# Patient Record
Sex: Male | Born: 1980
Health system: Southern US, Community
[De-identification: ages and names within clinical notes are randomized; demographics above are authoritative.]

## PROBLEM LIST (undated history)

## (undated) DIAGNOSIS — I10 Essential (primary) hypertension: Secondary | ICD-10-CM

## (undated) HISTORY — PX: SHOULDER SURGERY: SHX246

## (undated) HISTORY — PX: OTHER SURGICAL HISTORY: SHX169

## (undated) HISTORY — PX: KNEE SURGERY: SHX244

---
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
## 2020-05-26 ENCOUNTER — Emergency Department (HOSPITAL_BASED_OUTPATIENT_CLINIC_OR_DEPARTMENT_OTHER): Payer: BC Managed Care – PPO

## 2020-05-26 ENCOUNTER — Other Ambulatory Visit: Payer: Self-pay

## 2020-05-26 ENCOUNTER — Encounter (HOSPITAL_BASED_OUTPATIENT_CLINIC_OR_DEPARTMENT_OTHER): Payer: Self-pay

## 2020-05-26 ENCOUNTER — Emergency Department (HOSPITAL_BASED_OUTPATIENT_CLINIC_OR_DEPARTMENT_OTHER)
Admission: EM | Admit: 2020-05-26 | Discharge: 2020-05-26 | Disposition: A | Discharge status: Home / Self Care | Payer: BC Managed Care – PPO | Attending: Emergency Medicine | Admitting: Emergency Medicine

## 2020-05-26 DIAGNOSIS — I10 Essential (primary) hypertension: Secondary | ICD-10-CM | POA: Insufficient documentation

## 2020-05-26 DIAGNOSIS — Z20822 Contact with and (suspected) exposure to covid-19: Secondary | ICD-10-CM | POA: Diagnosis not present

## 2020-05-26 DIAGNOSIS — R0981 Nasal congestion: Secondary | ICD-10-CM

## 2020-05-26 DIAGNOSIS — F17290 Nicotine dependence, other tobacco product, uncomplicated: Secondary | ICD-10-CM | POA: Insufficient documentation

## 2020-05-26 LAB — CBC WITH DIFFERENTIAL/PLATELET
Abs Immature Granulocytes: 0.01 10*3/uL (ref 0.00–0.07)
Basophils Absolute: 0 10*3/uL (ref 0.0–0.1)
Basophils Relative: 1 %
Eosinophils Absolute: 0.1 10*3/uL (ref 0.0–0.5)
Eosinophils Relative: 2 %
HCT: 48.6 % (ref 39.0–52.0)
Hemoglobin: 16.4 g/dL (ref 13.0–17.0)
Immature Granulocytes: 0 %
Lymphocytes Relative: 42 %
Lymphs Abs: 2.4 10*3/uL (ref 0.7–4.0)
MCH: 29.8 pg (ref 26.0–34.0)
MCHC: 33.7 g/dL (ref 30.0–36.0)
MCV: 88.2 fL (ref 80.0–100.0)
Monocytes Absolute: 0.3 10*3/uL (ref 0.1–1.0)
Monocytes Relative: 6 %
Neutro Abs: 2.8 10*3/uL (ref 1.7–7.7)
Neutrophils Relative %: 49 %
Platelets: 233 10*3/uL (ref 150–400)
RBC: 5.51 MIL/uL (ref 4.22–5.81)
RDW: 14.6 % (ref 11.5–15.5)
WBC: 5.7 10*3/uL (ref 4.0–10.5)
nRBC: 0 % (ref 0.0–0.2)

## 2020-05-26 LAB — COMPREHENSIVE METABOLIC PANEL
ALT: 30 U/L (ref 0–44)
AST: 29 U/L (ref 15–41)
Albumin: 4.4 g/dL (ref 3.5–5.0)
Alkaline Phosphatase: 56 U/L (ref 38–126)
Anion gap: 13 (ref 5–15)
BUN: 16 mg/dL (ref 6–20)
CO2: 27 mmol/L (ref 22–32)
Calcium: 9.2 mg/dL (ref 8.9–10.3)
Chloride: 102 mmol/L (ref 98–111)
Creatinine, Ser: 1.25 mg/dL — ABNORMAL HIGH (ref 0.61–1.24)
GFR, Estimated: >60 mL/min (ref >60)
Glucose, Bld: 130 mg/dL — ABNORMAL HIGH (ref 70–99)
Potassium: 3.9 mmol/L (ref 3.5–5.1)
Sodium: 142 mmol/L (ref 135–145)
Total Bilirubin: 1.6 mg/dL — ABNORMAL HIGH (ref 0.3–1.2)
Total Protein: 8.2 g/dL — ABNORMAL HIGH (ref 6.5–8.1)

## 2020-05-26 LAB — URINALYSIS, ROUTINE W REFLEX MICROSCOPIC
Bilirubin Urine: NEGATIVE
Glucose, UA: NEGATIVE mg/dL
Hgb urine dipstick: NEGATIVE
Ketones, ur: NEGATIVE mg/dL
Leukocytes,Ua: NEGATIVE
Nitrite: NEGATIVE
Protein, ur: NEGATIVE mg/dL
Specific Gravity, Urine: 1.02 (ref 1.005–1.030)
pH: 6.5 (ref 5.0–8.0)

## 2020-05-26 LAB — RESP PANEL BY RT-PCR (FLU A&B, COVID) ARPGX2
Influenza A by PCR: NEGATIVE
Influenza B by PCR: NEGATIVE
SARS Coronavirus 2 by RT PCR: NEGATIVE

## 2020-05-26 LAB — SEDIMENTATION RATE: Sed Rate: 5 mm/hr (ref 0–16)

## 2020-05-26 MED ORDER — LOSARTAN POTASSIUM 25 MG PO TABS: 25.0000 mg | ORAL_TABLET | Freq: Every day | ORAL | 0 refills | Status: AC

## 2020-05-26 MED ORDER — FLUTICASONE PROPIONATE 50 MCG/ACT NA SUSP: 2.0000 | Freq: Every day | NASAL | 2 refills | Status: AC

## 2020-05-26 MED ORDER — IOHEXOL 350 MG/ML SOLN
100.0000 mL | Freq: Once | INTRAVENOUS | Status: AC | PRN
  Administered 2020-05-26: 17:00:00 100 mL via INTRAVENOUS

## 2020-05-26 MED ORDER — LOSARTAN POTASSIUM 25 MG PO TABS
25.0000 mg | ORAL_TABLET | Freq: Once | ORAL | Status: AC
  Administered 2020-05-26: 16:00:00 25 mg via ORAL
  Filled 2020-05-26: qty 1

## 2020-05-26 NOTE — Discharge Instructions (Addendum)
Follow up with your primary care provider Monday, December 20 at 9:45AM.   General Viral Syndrome Care Instructions:  Your nasal and sinus symptoms may be caused by a viral illness. Viruses do not require or respond to antibiotics. Treatment is symptomatic care and it is important to note that these symptoms may last for 7-14 days.   Hand washing: Wash your hands throughout the day, but especially before and after touching the face, using the restroom, sneezing, coughing, or touching surfaces that have been coughed or sneezed upon. Hydration: Symptoms of most illnesses will be intensified and complicated by dehydration. Dehydration can also extend the duration of symptoms. Drink plenty of fluids and get plenty of rest. You should be drinking at least half a liter of water an hour to stay hydrated. Electrolyte drinks (ex. Gatorade, Powerade, Pedialyte) are also encouraged. You should be drinking enough fluids to make your urine light yellow, almost clear. If this is not the case, you are not drinking enough water. Please note that some of the treatments indicated below will not be effective if you are not adequately hydrated. Diet: Please concentrate on hydration, however, you may introduce food slowly.  Start with a clear liquid diet, progressed to a full liquid diet, and then bland solids as you are able. Pain or fever: Ibuprofen, Naproxen, or acetaminophen (generic for Tylenol) for pain or fever.  Antiinflammatory medications: Take 600 mg of ibuprofen every 6 hours or 440 mg (over the counter dose) to 500 mg (prescription dose) of naproxen every 12 hours for the next 3 days. After this time, these medications may be used as needed for pain. Take these medications with food to avoid upset stomach. Choose only one of these medications, do not take them together. Acetaminophen (generic for Tylenol): Should you continue to have additional pain while taking the ibuprofen or naproxen, you may add in  acetaminophen as needed. Your daily total maximum amount of acetaminophen from all sources should be limited to 4000mg /day for persons without liver problems, or 2000mg /day for those with liver problems. Zyrtec or Claritin: May add these medication daily to control underlying symptoms of congestion, sneezing, and other signs of allergies.  These medications are available over-the-counter. Generics: Cetirizine (generic for Zyrtec) and loratadine (generic for Claritin). Fluticasone: Use fluticasone (generic for Flonase), as directed, for nasal and sinus congestion.  This medication is available over-the-counter. Congestion: Plain guaifenesin (generic for plain Mucinex) may help relieve congestion. Saline sinus rinses and saline nasal sprays may also help relieve congestion.  Sore throat: Warm liquids or Chloraseptic spray may help soothe a sore throat. Gargle twice a day with a salt water solution made from a half teaspoon of salt in a cup of warm water.  Return: Return to the ED for significantly worsening symptoms, shortness of breath, persistent vomiting, large amounts of blood in stool, or any other major concerns.  For prescription assistance, may try using prescription discount sites or apps, such as goodrx.com

## 2020-05-26 NOTE — ED Provider Notes (Signed)
[facility] Provider Note   CSN: [id number] Arrival date & time: 05/26/20  1351     History Chief Complaint  Patient presents with  . URI  . Hypertension    Douglas Barnett is a 39 y.o. male.  HPI      Douglas Barnett is a 39 y.o. 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 He went to urgent care because he experienced an incident yesterday where he was sitting in his car and noticed swelling to the right temple. This lasted for about 5 min, but then resolved and has not recurred to the same extent, though he does say he thinks his right temple currently seems more swollen than the left. When asked about pain, he denies any pain in this area, however, when speaking to him about his sinus complaints below, he mentions pain that extend from the forehead toward the right temple.  When the urgent care noted his blood pressure, they sent him to the emergency department.  Patient does not have a history of hypertension as far as he knows.  He does state he has had increased stress from a new job. He has not been able to work out as much as he used to and has not been eating as nutritiously due to increased travel.  He notes he has had increased instances of becoming winded after a long flight of stairs, especially over the last 2 weeks.  When asked about vision changes, he indicates has had what he describes as blurry vision, worse on the right eye, but this has been present for about the last 2 years.  It is constant, does not seem to be worsening, and has only been noted with distance vision.  He does not wear glasses or use contact lenses.  He has been experiencing nasal congestion, rhinorrhea, and sinus pressure for about the last week.  He was initially experiencing ear pressure, especially on the left, following a recent airplane flight, but this has resolved.  He also notes after rinsing  with a Nettie pot last night his sinus symptoms improved.    Denies fever/chills, dizziness, syncope, N/V/D, vision loss, chest pain, current shortness of breath, cough, abdominal pain, new back pain, facial droop, cognitive deficits, weakness, numbness, neck pain, or any other complaints.   Past Medical History:  Diagnosis Date  . Optic nerve tumor, right 1982    There are no problems to display for this patient.   Past Surgical History:  Procedure Laterality Date  . KNEE SURGERY    . optic nerve tumor surgery    . SHOULDER SURGERY         History reviewed. No pertinent family history.  Social History   Tobacco Use  . Smoking status: Current Some Day Smoker    Types: Cigars  . Smokeless tobacco: Never Used  Vaping Use  . Vaping Use: Never used  Substance Use Topics  . Alcohol use: Yes    Comment: occ  . Drug use: Never    Home Medications Prior to Admission medications   Medication Sig Start Date End Date Taking? Authorizing Provider  fluticasone (FLONASE) 50 MCG/ACT nasal spray Place 2 sprays into both nostrils daily. 05/26/20   Thelmer Legler C, PA-C  losartan (COZAAR) 25 MG tablet Take 1 tablet (25 mg total) by mouth daily. 05/26/20 06/25/20  Veera Stapleton, Helane Gunther, PA-C    Allergies    Penicillins  Review  of Systems   Review of Systems  Constitutional: Negative for chills, diaphoresis and fever.  HENT: Positive for congestion, facial swelling, rhinorrhea and sinus pressure. Negative for ear discharge, hearing loss, sore throat, trouble swallowing and voice change.   Eyes: Positive for visual disturbance. Negative for photophobia and pain.  Respiratory: Negative for cough and shortness of breath.   Cardiovascular: Negative for chest pain and leg swelling.  Gastrointestinal: Negative for abdominal pain, diarrhea, nausea and vomiting.  Genitourinary: Negative for difficulty urinating.  Musculoskeletal: Negative for back pain and neck pain.  Neurological: Negative for  dizziness, seizures, syncope, facial asymmetry, speech difficulty, weakness, light-headedness and headaches.  All other systems reviewed and are negative.   Physical Exam Updated Vital Signs BP (!) 177/135 (BP Location: Left Arm)   Pulse 82   Temp 98.4 F (36.9 C) (Oral)   Resp 20   Ht 6' (1.829 m)   Wt 97.5 kg   SpO2 100%   BMI 29.16 kg/m   Physical Exam Vitals and nursing note reviewed.  Constitutional:      General: He is not in acute distress.    Appearance: He is well-developed. He is not diaphoretic.  HENT:     Head: Normocephalic and atraumatic. No right periorbital erythema or left periorbital erythema.     Jaw: No trismus, tenderness, swelling or pain on movement.     Comments: No obvious swelling to the right temple or other areas of the scalp when compared to left. No tenderness, color abnormality. No obvious increased pulsitility.     Right Ear: Tympanic membrane, ear canal and external ear normal.     Left Ear: Tympanic membrane, ear canal and external ear normal.     Nose: Mucosal edema and congestion present.     Right Sinus: No maxillary sinus tenderness or frontal sinus tenderness.     Left Sinus: No maxillary sinus tenderness or frontal sinus tenderness.     Mouth/Throat:     Mouth: Mucous membranes are moist.     Pharynx: Oropharynx is clear.  Eyes:     Extraocular Movements: Extraocular movements intact.     Conjunctiva/sclera: Conjunctivae normal.     Pupils: Pupils are equal, round, and reactive to light.     Comments:   Visual Acuity  Right Eye Distance: 20/30 Left Eye Distance: 20/20 Bilateral Distance: 20/20 (Pt did not use corrective lenses for thest exams)  Right Eye Near:   Left Eye Near:    Bilateral Near:     Cardiovascular:     Rate and Rhythm: Normal rate and regular rhythm.     Pulses: Normal pulses.          Radial pulses are 2+ on the right side and 2+ on the left side.       Posterior tibial pulses are 2+ on the right side and 2+  on the left side.     Heart sounds: Normal heart sounds.     Comments: Tactile temperature in the extremities appropriate and equal bilaterally. Pulmonary:     Effort: Pulmonary effort is normal. No respiratory distress.     Breath sounds: Normal breath sounds.  Abdominal:     Palpations: Abdomen is soft.     Tenderness: There is no abdominal tenderness. There is no guarding.  Musculoskeletal:     Cervical back: Normal range of motion and neck supple. No tenderness. No pain with movement.     Right lower leg: No edema.     Left  lower leg: No edema.  Lymphadenopathy:     Cervical: No cervical adenopathy.  Skin:    General: Skin is warm and dry.  Neurological:     Mental Status: He is alert and oriented to person, place, and time.     Comments: No noted acute cognitive deficit. Sensation grossly intact to light touch in the extremities.   Grip strengths equal bilaterally.   Strength 5/5 in all extremities.  No gait disturbance.  Coordination intact.  Cranial nerves III-XII grossly intact.  Handles oral secretions without noted difficulty.  No noted phonation or speech deficit. No facial droop.   Psychiatric:        Mood and Affect: Mood and affect normal.        Speech: Speech normal.        Behavior: Behavior normal.     ED Results / Procedures / Treatments   Labs (all labs ordered are listed, but only abnormal results are displayed) Labs Reviewed  COMPREHENSIVE METABOLIC PANEL - Abnormal; Notable for the following components:      Result Value   Glucose, Bld 130 (*)    Creatinine, Ser 1.25 (*)    Total Protein 8.2 (*)    Total Bilirubin 1.6 (*)    All other components within normal limits  RESP PANEL BY RT-PCR (FLU A&B, COVID) [id number]  URINALYSIS, ROUTINE W REFLEX MICROSCOPIC  SEDIMENTATION RATE  CBC WITH DIFFERENTIAL/PLATELET    EKG EKG Interpretation  Date/Time:  Thursday May 26 2020 15:36:24 EST Ventricular Rate:  67 PR Interval:    QRS  Duration: 83 QT Interval:  383 QTC Calculation: 405 R Axis:   70 Text Interpretation: Sinus rhythm no prior for comparison Confirmed by Madalyn Rob [phone]) on 05/26/2020 7:27:02 PM   Radiology CT Angio Head W or Wo Contrast  Result Date: 05/26/2020 CLINICAL DATA:  Vision loss, monocular. Additional history provided: Vision loss, flying recently, felt "pop" in right ear, sinusitis symptoms today, elevated blood pressure, history of right optic nerve tumor 39 years ago. EXAM: CT ANGIOGRAPHY HEAD TECHNIQUE: Multidetector CT imaging of the head was performed using the standard protocol during bolus administration of intravenous contrast. Multiplanar CT image reconstructions and MIPs were obtained to evaluate the vascular anatomy. CONTRAST:  157mL OMNIPAQUE IOHEXOL 350 MG/ML SOLN COMPARISON:  No pertinent prior exams available for comparison. FINDINGS: CT HEAD Brain: Cerebral volume is normal for age. There is no acute intracranial hemorrhage. No demarcated cortical infarct. No extra-axial fluid collection. No evidence of intracranial mass. No midline shift. Vascular: Reported below. Skull: Right frontal cranioplasty. No calvarial fracture or focal suspicious osseous lesion. Sinuses: Bilateral sphenoid sinus mucous retention cysts Orbits: Small linear hyperdensity along the right optic nerve sheath likely reflecting a metallic clip. CTA HEAD Anterior circulation: The intracranial internal carotid arteries are patent. The M1 middle cerebral arteries are patent. No M2 proximal branch occlusion or high-grade proximal stenosis is identified. Mild atherosclerotic irregularity of the M2 and more distal MCA branch vessels bilaterally. No intracranial aneurysm is identified. Posterior circulation: The visualized intracranial vertebral arteries are patent. The basilar artery is patent. The posterior cerebral arteries are patent. Posterior communicating arteries are present bilaterally. Venous sinuses: Within the  limitations of contrast timing, no convincing thrombus. Anatomic variants: None significant IMPRESSION: CT head: 1. No evidence of acute intracranial abnormality. 2. Right frontal cranioplasty. A small linear hyperdensity along the right optic nerve sheath likely reflects a metallic clip. Correlate with procedural history. 3. Bilateral sphenoid sinus mucous retention cyst. CTA  head: 1. No intracranial large vessel occlusion or proximal high-grade arterial stenosis. Mild atherosclerotic irregularity of the M2 and more distal MCA branch vessels bilaterally. 2. No intracranial aneurysm is identified. Electronically Signed   By: Kellie Simmering DO   On: 05/26/2020 17:25   DG Chest 2 View  Result Date: 05/26/2020 CLINICAL DATA:  Shortness of breath for several weeks EXAM: CHEST - 2 VIEW COMPARISON:  None. FINDINGS: Cardiac shadow is within normal limits. The lungs are clear bilaterally. No bony abnormality is noted. IMPRESSION: No active cardiopulmonary disease. Electronically Signed   By: Inez Catalina M.D.   On: 05/26/2020 17:23    Procedures Procedures (including critical care time)  Medications Ordered in ED Medications  losartan (COZAAR) tablet 25 mg (25 mg Oral Given 05/26/20 1557)  iohexol (OMNIPAQUE) 350 MG/ML injection 100 mL (100 mLs Intravenous Contrast Given 05/26/20 1653)    ED Course  I have reviewed the triage vital signs and the nursing notes.  Pertinent labs & imaging results that were available during my care of the patient were reviewed by me and considered in my medical decision making (see chart for details).  Clinical Course as of 05/27/20 0008  Thu May 26, 2020  1525 BP(!): 154/115 Last noted BP in the system was 126/78 at an annual physical with his PCP June 2021. I adjusted the patient's blood pressure cuff and increase the size of the cuff to the circumference of the patient's arms and was able to obtain this somewhat improved measurement compared to previous values  obtained in his visit. [SJ]  [place] with Nena Polio, NP, patient's PCP. We discussed the patient's symptoms, blood pressure, and proposed work-up here in the ED. She requests we start the patient on losartan 25 mg daily.  She will continue to manage and work-up the patient's hypertension. She has had a cancellation in her schedule and so she will see the patient on December 20 at 9:45 AM. We discussed that we would obtain CTA of the head here in the ED.  She will coordinate obtaining MRI of the brain with and without contrast. [SJ]  1805 Creatinine(!): 1.25 Noted to be 1.43 in June 2021. [SJ]  1810 Total Bilirubin(!): 1.6 Noted to be 1.8 in June 2021. [SJ]    Clinical Course User Index [SJ] Alegandro Macnaughton, Helane Gunther, PA-C   MDM Rules/Calculators/A&P                          Patient presents with a few complaints. Patient is nontoxic appearing, afebrile, not tachycardic, not tachypneic, not hypotensive, maintains excellent SPO2 on room air, and is in no apparent distress.   I have reviewed the patient's chart to obtain more information.   I reviewed and interpreted the patient's labs and radiological studies. No focal neurologic deficits.  No acute, emergent abnormalities on CTA. ESR negative.  CRP would have been a send out test and so therefore was not performed at this site. Recommended and referred for ophthalmology follow-up. He has close follow-up scheduled with his PCP.  The patient was given instructions for home care as well as return precautions. Patient voices understanding of these instructions, accepts the plan, and is comfortable with discharge.  Findings and plan of care discussed with attending physician, Madalyn Rob, MD. Dr. Roslynn Amble personally evaluated and examined this patient.    Final Clinical Impression(s) / ED Diagnoses Final diagnoses:  Primary hypertension  Sinus congestion    Rx / DC  Orders ED Discharge Orders         Ordered    losartan (COZAAR) 25  MG tablet  Daily        05/26/20 1850    fluticasone (FLONASE) 50 MCG/ACT nasal spray  Daily        05/26/20 1850           Layla Maw 05/27/20 0008    Lucrezia Starch, MD 05/28/20 1216

## 2020-05-26 NOTE — ED Notes (Signed)
Pt at CT will get vitals when he returns

## 2020-05-26 NOTE — ED Triage Notes (Signed)
Pt c/o sinus issues and ears popping x 1 week-states he does fly 3-4x/week-concerned he had "swelling to right temple yesterday"/vision changes to right eye-states he was seen at [facility] and sent to ED for eval and advised his BP was high-NAD-steady gait

## 2020-05-26 NOTE — ED Notes (Signed)
Pt is on the monitor and vitals cycling

## 2020-07-28 ENCOUNTER — Other Ambulatory Visit (HOSPITAL_COMMUNITY): Payer: Self-pay | Admitting: Student

## 2020-07-28 ENCOUNTER — Emergency Department (HOSPITAL_BASED_OUTPATIENT_CLINIC_OR_DEPARTMENT_OTHER)
Admission: EM | Admit: 2020-07-28 | Discharge: 2020-07-28 | Disposition: A | Discharge status: Home / Self Care | Payer: BC Managed Care – PPO | Attending: Emergency Medicine | Admitting: Emergency Medicine

## 2020-07-28 ENCOUNTER — Other Ambulatory Visit: Payer: Self-pay

## 2020-07-28 ENCOUNTER — Encounter (HOSPITAL_BASED_OUTPATIENT_CLINIC_OR_DEPARTMENT_OTHER): Payer: Self-pay | Admitting: *Deleted

## 2020-07-28 DIAGNOSIS — S4992XA Unspecified injury of left shoulder and upper arm, initial encounter: Secondary | ICD-10-CM | POA: Diagnosis present

## 2020-07-28 DIAGNOSIS — I10 Essential (primary) hypertension: Secondary | ICD-10-CM | POA: Insufficient documentation

## 2020-07-28 DIAGNOSIS — X58XXXA Exposure to other specified factors, initial encounter: Secondary | ICD-10-CM | POA: Insufficient documentation

## 2020-07-28 DIAGNOSIS — F17290 Nicotine dependence, other tobacco product, uncomplicated: Secondary | ICD-10-CM | POA: Diagnosis not present

## 2020-07-28 DIAGNOSIS — Z79899 Other long term (current) drug therapy: Secondary | ICD-10-CM | POA: Diagnosis not present

## 2020-07-28 DIAGNOSIS — S46212A Strain of muscle, fascia and tendon of other parts of biceps, left arm, initial encounter: Secondary | ICD-10-CM | POA: Diagnosis not present

## 2020-07-28 HISTORY — DX: Essential (primary) hypertension: I10

## 2020-07-28 LAB — CBG MONITORING, ED: Glucose-Capillary: 90 mg/dL (ref 70–99)

## 2020-07-28 MED ORDER — OXYCODONE-ACETAMINOPHEN 5-325 MG PO TABS
1.0000 | ORAL_TABLET | Freq: Once | ORAL | Status: AC
  Administered 2020-07-28: 1 via ORAL

## 2020-07-28 MED ORDER — OXYCODONE-ACETAMINOPHEN 5-325 MG PO TABS: 1.0000 | ORAL_TABLET | Freq: Three times a day (TID) | ORAL | 0 refills | Status: AC | PRN

## 2020-07-28 MED ORDER — HYDRALAZINE HCL 25 MG PO TABS
50.0000 mg | ORAL_TABLET | Freq: Once | ORAL | Status: AC
  Administered 2020-07-28: 50 mg via ORAL
  Filled 2020-07-28: qty 2

## 2020-07-28 MED ORDER — OXYCODONE-ACETAMINOPHEN 5-325 MG PO TABS
ORAL_TABLET | ORAL | Status: AC
  Filled 2020-07-28: qty 1

## 2020-07-28 MED FILL — OXYCODONE-ACETAMINOPHEN 5-3: 5-325 | 1 days supply | Qty: 3 | Fill #0

## 2020-07-28 NOTE — ED Triage Notes (Addendum)
Left shoulder pain after working out a week ago. No relief with hot and cold compresses. After he googled causes of arm pain he thought he should be checked out. He was seen at an UC and given Flexeril. Pain with movement.

## 2020-07-28 NOTE — Discharge Instructions (Addendum)
You are seen today for muscle strain, as we discussed this can take time to heal. Please follow-up with your orthopedic doctor tomorrow, keep this appointment. Use the Flexeril provided to you, I also want you to take ibuprofen or Tylenol as directed on the bottle, please use the attached instructions. If the pain is severe you can take the Percocet prescribed, do not this is a narcotic, do not drive or operate heavy machinery when taking this medication. They speak to pharmacist about all side effects of this medication. Your blood pressure was also severely elevated today, as we discussed is most likely due to pain, however I want you to follow-up with your primary care in the next couple of days to get this evaluated, use a blood pressure sheet to keep an eye on your blood pressure.  I would recommend buying a blood pressure cuff to keep an eye on this.  If you have any symptoms of chest pain or shortness of breath or headache with elevated blood pressure please come back here.  Get help right away if: You have any of these problems in your injured area: You have numbness. You have tingling. You lose a lot of strength.

## 2020-07-28 NOTE — ED Provider Notes (Signed)
[facility] EMERGENCY DEPARTMENT Provider Note   CSN: [id number] Arrival date & time: 07/28/20  1329     History Chief Complaint  Patient presents with  . Shoulder Pain    Douglas Barnett is a 40 y.o. male with past medical history of hypertension presents emergency department today for shoulder injury.  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  States that shoulder pain is now progressing into his left arm.  Has been worsening, is constant, 10 on 10.  Is able to still use that arm, flex and extend and abduct and adduct arm.  Denies any weakness into the arm, denies any numbness or tingling.  States that he went to urgent care and was given Flexeril, states that he has not been helping.  Primarily here because he wants pain relief.  States he has had appointment with his orthopedic doctor tomorrow and an appointment with his primary care this week.  States that he is also here because he noticed that for the past week since his injury his blood pressure has been slightly increased, 158/108 currently, patient states that he does take losartan and metoprolol for this, has been taking this regularly.  Denies any chest pain, shortness of breath, vision changes, headache, paresthesias.  Did not injure himself elsewhere.  No other complaints. HPI     Past Medical History:  Diagnosis Date  . Hypertension   . Optic nerve tumor, right 1982    There are no problems to display for this patient.   Past Surgical History:  Procedure Laterality Date  . KNEE SURGERY    . optic nerve tumor surgery    . SHOULDER SURGERY         No family history on file.  Social History   Tobacco Use  . Smoking status: Current Some Day Smoker    Types: Cigars  . Smokeless tobacco: Never  Used  Vaping Use  . Vaping Use: Never used  Substance Use Topics  . Alcohol use: Yes    Comment: occ  . Drug use: Never    Home Medications Prior to Admission medications   Medication Sig Start Date End Date Taking? Authorizing Provider  cyclobenzaprine (FLEXERIL) 10 MG tablet Take by mouth. 07/26/20 08/06/20 Yes [provider]  losartan (COZAAR) 50 MG tablet Take by mouth. 07/21/20 08/26/20 Yes [provider]  metoprolol succinate (TOPROL-XL) 50 MG 24 hr tablet Take by mouth. 07/28/20  Yes [provider]  oxyCODONE-acetaminophen (PERCOCET/ROXICET) 5-325 MG tablet Take 1 tablet by mouth every 8 (eight) hours as needed for severe pain. 07/28/20  Yes Reneka Nebergall, PA-C  fluticasone (FLONASE) 50 MCG/ACT nasal spray Place 2 sprays into both nostrils daily. 05/26/20   Joy, Shawn C, PA-C  losartan (COZAAR) 25 MG tablet Take 1 tablet (25 mg total) by mouth daily. 05/26/20 06/25/20  Joy, Helane Gunther, PA-C    Allergies    Penicillins  Review of Systems   Review of Systems  Constitutional: Negative for diaphoresis, fatigue and fever.  Eyes: Negative for visual disturbance.  Respiratory: Negative for shortness of breath.   Cardiovascular: Negative for chest pain.  Gastrointestinal: Negative for nausea and vomiting.  Musculoskeletal: Positive for arthralgias. Negative for back pain and myalgias.  Skin: Negative for color change,  pallor, rash and wound.  Neurological: Negative for syncope, weakness, light-headedness, numbness and headaches.  Psychiatric/Behavioral: Negative for behavioral problems and confusion.    Physical Exam Updated Vital Signs BP (!) 157/108   Pulse 66   Temp 97.8 F (36.6 C) (Oral)   Resp 12   Ht 6\' 1"  (1.854 m)   Wt 101.5 kg   SpO2 97%   BMI 29.51 kg/m   Physical Exam Constitutional:      General: He is not in acute distress.    Appearance: Normal appearance. He is not ill-appearing, toxic-appearing or diaphoretic.  HENT:     Head:  Normocephalic and atraumatic.  Eyes:     Extraocular Movements: Extraocular movements intact.     Pupils: Pupils are equal, round, and reactive to light.  Cardiovascular:     Rate and Rhythm: Normal rate and regular rhythm.     Pulses: Normal pulses.  Pulmonary:     Effort: Pulmonary effort is normal.     Breath sounds: Normal breath sounds.  Musculoskeletal:        General: Normal range of motion.       Arms:     Cervical back: Normal range of motion. No rigidity.     Comments: Patient with tenderness to muscles following biceps muscle, specifically long head of biceps brachii muscle.  Patient also does have pain to biceps brachii tendon.  Normal strength to biceps, no biceps deformity.  No ecchymosis.  Normal strength to shoulder, elbow and wrist.  Normal sensation throughout.  Cap refill less than 2 seconds.  Radial pulse 2+.  Skin:    General: Skin is warm and dry.     Capillary Refill: Capillary refill takes less than 2 seconds.  Neurological:     General: No focal deficit present.     Mental Status: He is alert and oriented to person, place, and time.     Cranial Nerves: No cranial nerve deficit.     Motor: No weakness.  Psychiatric:        Mood and Affect: Mood normal.        Behavior: Behavior normal.        Thought Content: Thought content normal.     ED Results / Procedures / Treatments   Labs (all labs ordered are listed, but only abnormal results are displayed) Labs Reviewed  CBG MONITORING, ED    EKG EKG Interpretation  Date/Time:  Thursday July 28 2020 13:49:06 EST Ventricular Rate:  90 PR Interval:  174 QRS Duration: 78 QT Interval:  334 QTC Calculation: 408 R Axis:   66 Text Interpretation: Normal sinus rhythm Normal ECG No acute changes No significant change since last tracing Confirmed by Varney Biles [phone]) on 07/28/2020 3:09:16 PM   Radiology No results found.  Procedures Procedures   Medications Ordered in ED Medications   oxyCODONE-acetaminophen (PERCOCET/ROXICET) 5-325 MG per tablet 1 tablet (1 tablet Oral Given 07/28/20 1504)  hydrALAZINE (APRESOLINE) tablet 50 mg (50 mg Oral Given 07/28/20 1543)    ED Course  I have reviewed the triage vital signs and the nursing notes.  Pertinent labs & imaging results that were available during my care of the patient were reviewed by me and considered in my medical decision making (see chart for details).    MDM Rules/Calculators/A&P                         Douglas Barnett is a 40 y.o. male with past medical  history of hypertension presents emergency department today for shoulder injury.  Patient with pain with pattern to long head of biceps brachii muscle, most likely sprained.  No deformity, no concerns for rupture.  Normal strength and sensation, patient is distally neurovascularly intact.  Will provide pain relief, in regards to blood pressure, most likely elevated due to pain.  Patient does have orthopedic doctor appointment tomorrow and primary care follow-up.  Patient's blood pressure elevated in the emergency department today. Patient denies headache, change in vision, numbness, weakness, chest pain, dyspnea, dizziness, or lightheadedness therefore doubt hypertensive emergency. Discussed elevated blood pressure with the patient and the need for primary care follow up with potential need to initiate or change antihypertensive medications and or for further evaluation. Discussed return precaution signs/symptoms for hypertensive emergency as listed above with the patient. He/she confirmed understanding.    Upon reevaluation, patient states that pain is significantly improved with Percocet, patient be discharged, will give small dose of Percocet until patient sees orthopedic tomorrow.  Strict return precautions given.  In regards to blood pressure, did express to patient that he should buy blood pressure cuff and monitor this at home, patient will follow up with primary care  in regards to changing blood pressure medications.   Doubt need for further emergent work up at this time. I explained the diagnosis and have given explicit precautions to return to the ER including for any other new or worsening symptoms. The patient understands and accepts the medical plan as it's been dictated and I have answered their questions. Discharge instructions concerning home care and prescriptions have been given. The patient is STABLE and is discharged to home in good condition.    Final Clinical Impression(s) / ED Diagnoses Final diagnoses:  Strain of left biceps, initial encounter    Rx / DC Orders ED Discharge Orders         Ordered    oxyCODONE-acetaminophen (PERCOCET/ROXICET) 5-325 MG tablet  Every 8 hours PRN        07/28/20 1637           Alfredia Client, PA-C 07/28/20 2118    Varney Biles, MD 08/02/20 1651

## 2020-07-28 NOTE — ED Notes (Signed)
Pt. Reports he felt something in his neck while working out on Friday and now he is feeling pain in the L arm and L shoulder.  Pt. Reports he has been using heat and ice and taking NSAIDs.  Pt. Reports he felt some relief in the shoulder and arm with the ice.  Pt. Michela Pitcher he noticed his B/P going up and went 24 hours with out taking his Losartin on Tuesday.  Pt. Reports on Yesterday he got his script filled after getting it at [facility].

## 2022-06-16 IMAGING — CT CT ANGIO HEAD
3 of 10 series · 14 of 47 positions shown · IV contrast (Omnipaque)
Comparison: No pertinent prior exams available for comparison.

CLINICAL DATA: Vision loss, monocular. Additional history provided:
Vision loss, flying recently, felt "pop" in right ear, sinusitis
symptoms today, elevated blood pressure, history of right optic
nerve tumor 39 years ago.

EXAM:
CT ANGIOGRAPHY HEAD
TECHNIQUE: Multidetector CT imaging of the head was performed using the
standard protocol during bolus administration of intravenous
contrast. Multiplanar CT image reconstructions and MIPs were
obtained to evaluate the vascular anatomy.
CONTRAST:  100mL OMNIPAQUE IOHEXOL 350 MG/ML SOLN

[Series 12: ax thin · axial · 0.39mm/px · z∈[+868,+997]mm · 8 of 155 slices shown]
[im 13/155  brain]
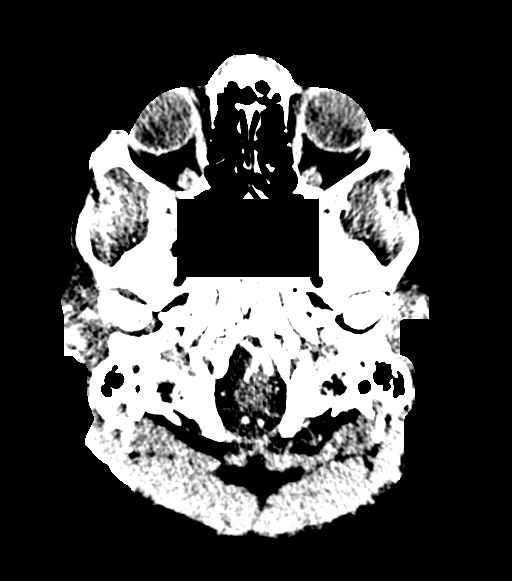
[im 39/155  bone]
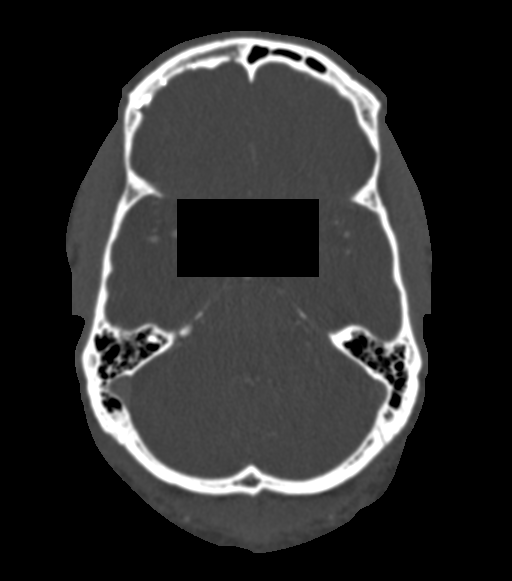
[im 52/155  brain]
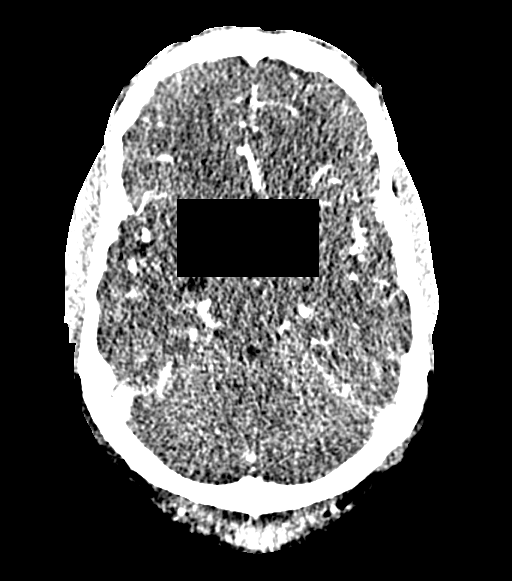
[im 65/155  bone]
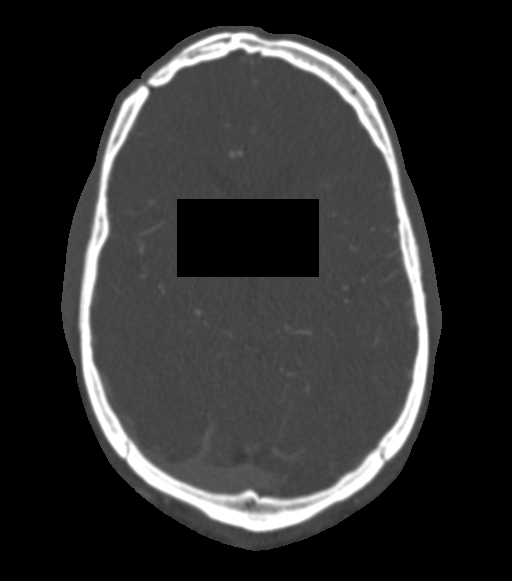
[im 90/155  brain]
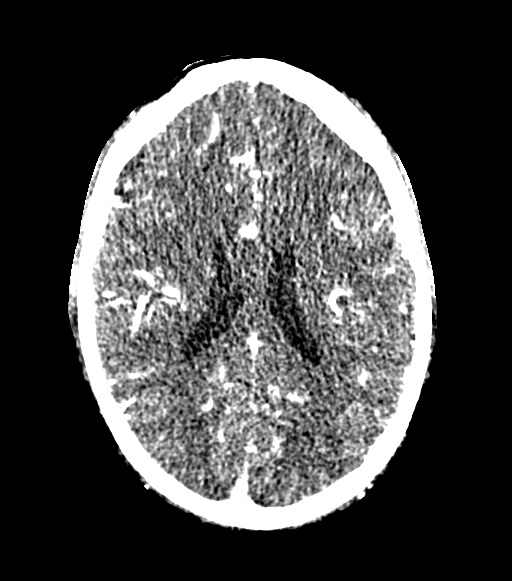
[im 103/155  bone]
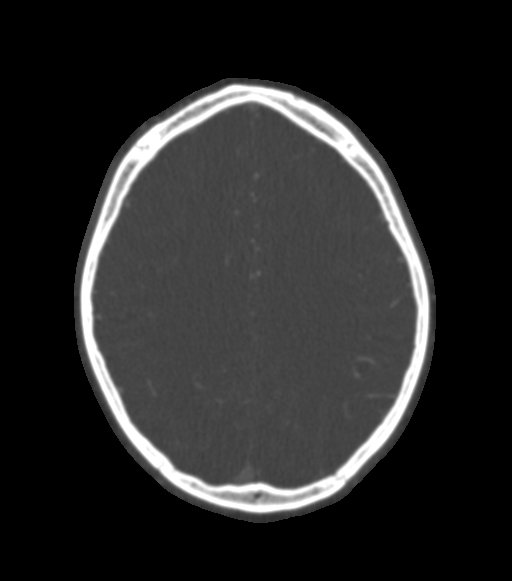
[im 116/155  brain]
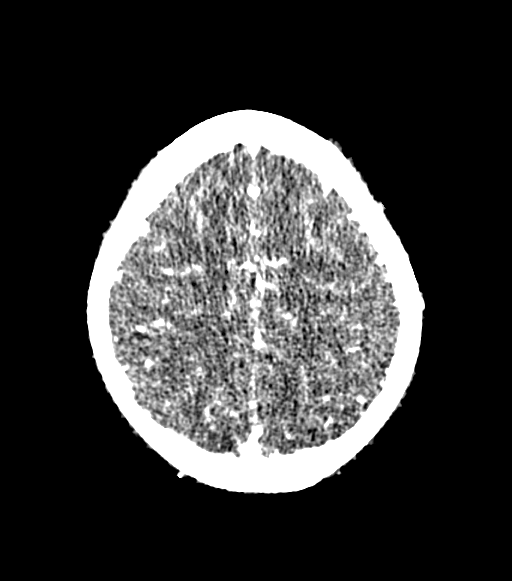
[im 142/155  bone]
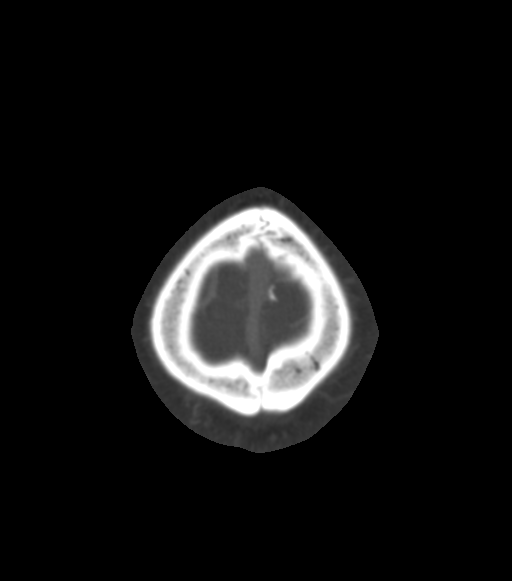

[Series 14: cor thin · coronal · 0.30mm/px · 3 of 201 slices shown]
[im 58/201  brain]
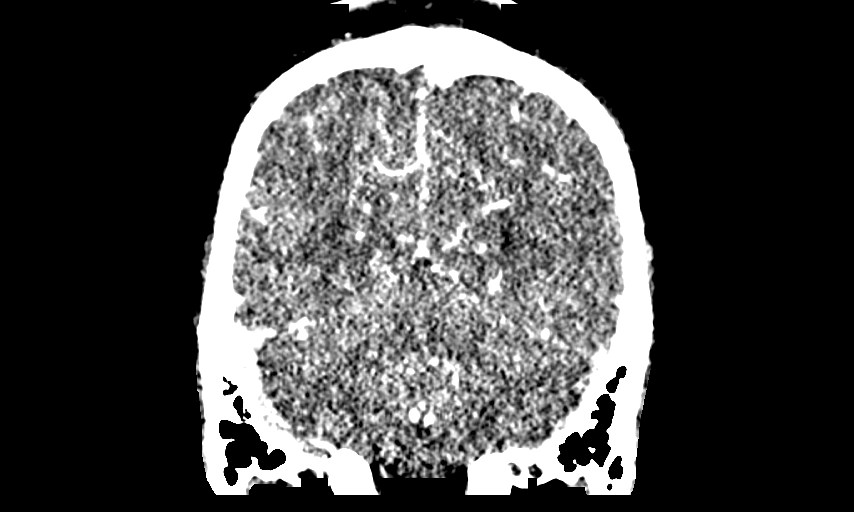
[im 86/201  brain]
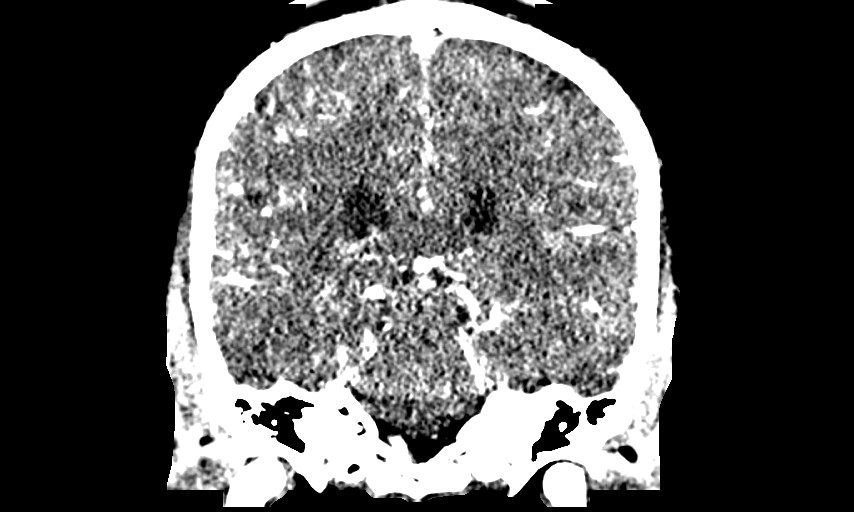
[im 115/201  brain]
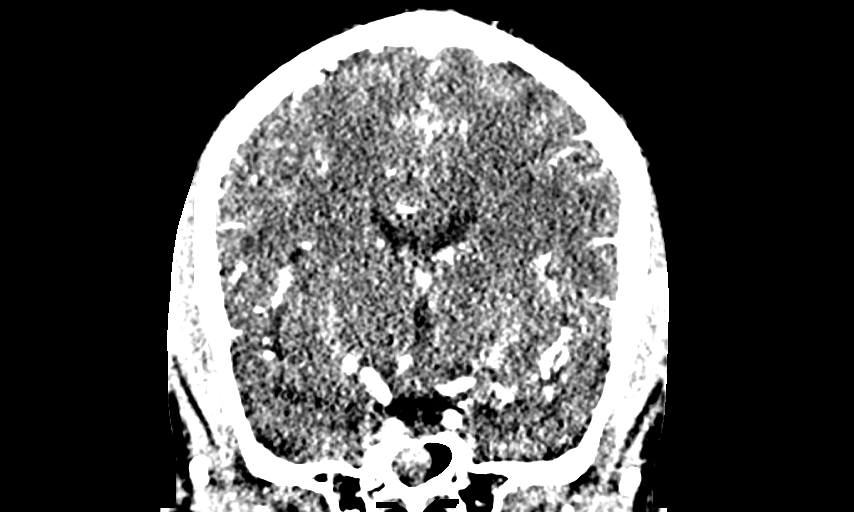

[Series 16: sag thin · sagittal · 0.31mm/px · 3 of 201 slices shown]
[im 41/201  brain]
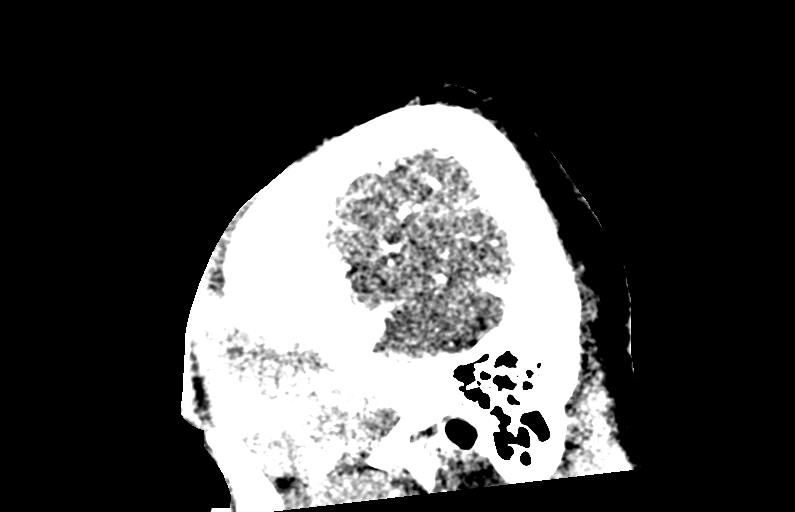
[im 81/201  brain]
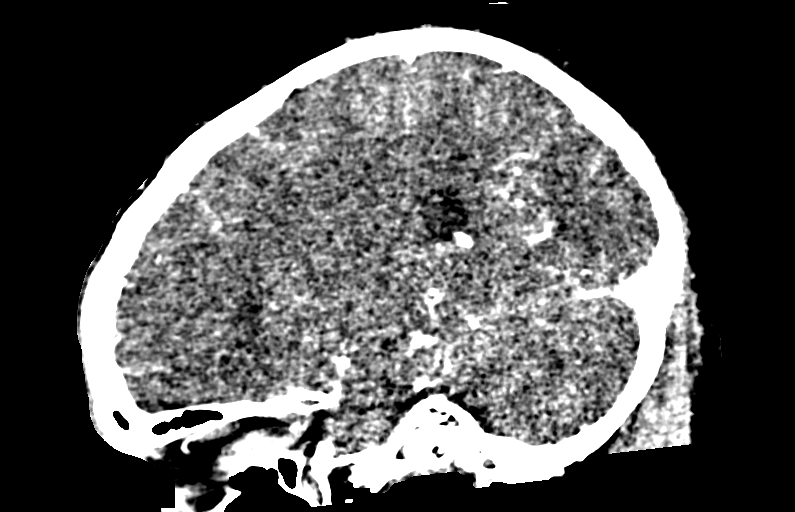
[im 121/201  brain]
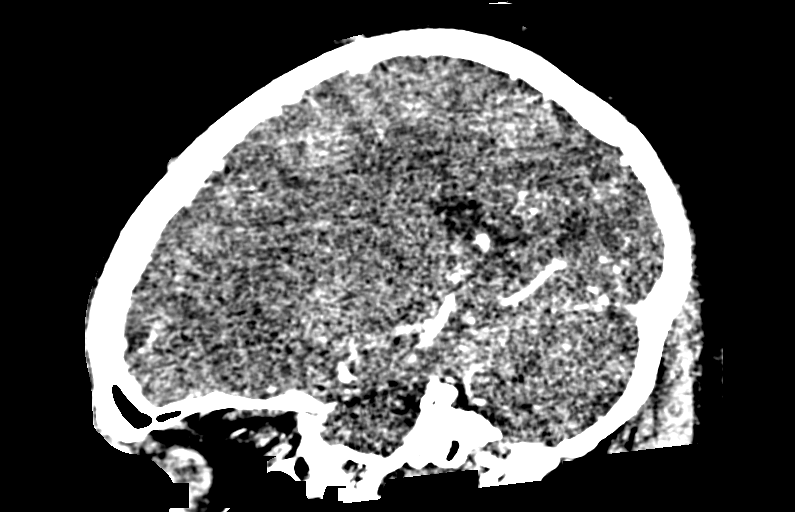

[14 of 47 positions shown; findings below may reference images not displayed]

FINDINGS: CT HEAD

Brain:

Cerebral volume is normal for age.

There is no acute intracranial hemorrhage.

No demarcated cortical infarct.

No extra-axial fluid collection.

No evidence of intracranial mass.

No midline shift.

Vascular: Reported below.

Skull: Right frontal cranioplasty. No calvarial fracture or focal
suspicious osseous lesion.

Sinuses: Bilateral sphenoid sinus mucous retention cysts

Orbits: Small linear hyperdensity along the right optic nerve sheath
likely reflecting a metallic clip.

CTA HEAD

Anterior circulation:

The intracranial internal carotid arteries are patent. The M1 middle
cerebral arteries are patent. No M2 proximal branch occlusion or
high-grade proximal stenosis is identified. Mild atherosclerotic
irregularity of the M2 and more distal MCA branch vessels
bilaterally. No intracranial aneurysm is identified.

Posterior circulation:

The visualized intracranial vertebral arteries are patent. The
basilar artery is patent. The posterior cerebral arteries are
patent. Posterior communicating arteries are present bilaterally.

Venous sinuses: Within the limitations of contrast timing, no
convincing thrombus.

Anatomic variants: None significant
IMPRESSION: CT head:

1. No evidence of acute intracranial abnormality.
2. Right frontal cranioplasty. A small linear hyperdensity along the
right optic nerve sheath likely reflects a metallic clip. Correlate
with procedural history.
3. Bilateral sphenoid sinus mucous retention cyst.

CTA head:

1. No intracranial large vessel occlusion or proximal high-grade
arterial stenosis. Mild atherosclerotic irregularity of the M2 and
more distal MCA branch vessels bilaterally.
2. No intracranial aneurysm is identified.
